# Patient Record
Sex: Male | Born: 1985 | Race: Black or African American | Hispanic: No | Marital: Married | Smoking: Never smoker
Health system: Southern US, Community
[De-identification: ages and names within clinical notes are randomized; demographics above are authoritative.]

## PROBLEM LIST (undated history)

## (undated) DIAGNOSIS — J45909 Unspecified asthma, uncomplicated: Secondary | ICD-10-CM

---
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
## 2004-03-17 ENCOUNTER — Emergency Department (HOSPITAL_COMMUNITY): Admission: EM | Admit: 2004-03-17 | Discharge: 2004-03-17 | Payer: Self-pay | Admitting: Emergency Medicine

## 2009-10-24 ENCOUNTER — Emergency Department (HOSPITAL_COMMUNITY): Admission: EM | Admit: 2009-10-24 | Discharge: 2009-10-25 | Payer: Self-pay | Admitting: Emergency Medicine

## 2009-10-28 ENCOUNTER — Encounter: Admission: RE | Admit: 2009-10-28 | Discharge: 2009-10-28 | Payer: Self-pay | Admitting: Chiropractic Medicine

## 2009-10-28 ENCOUNTER — Emergency Department (HOSPITAL_COMMUNITY): Admission: EM | Admit: 2009-10-28 | Discharge: 2009-10-28 | Payer: Self-pay | Admitting: Emergency Medicine

## 2009-12-20 ENCOUNTER — Ambulatory Visit (HOSPITAL_COMMUNITY): Admission: RE | Admit: 2009-12-20 | Discharge: 2009-12-20 | Payer: Self-pay | Admitting: Chiropractic Medicine

## 2011-03-20 ENCOUNTER — Emergency Department (HOSPITAL_COMMUNITY)
Admission: EM | Admit: 2011-03-20 | Discharge: 2011-03-20 | Disposition: A | Discharge status: Home / Self Care | Payer: No Typology Code available for payment source | Attending: Emergency Medicine | Admitting: Emergency Medicine

## 2011-03-20 ENCOUNTER — Emergency Department (HOSPITAL_COMMUNITY): Payer: No Typology Code available for payment source

## 2011-03-20 DIAGNOSIS — IMO0002 Reserved for concepts with insufficient information to code with codable children: Secondary | ICD-10-CM | POA: Insufficient documentation

## 2011-03-20 DIAGNOSIS — Y92410 Unspecified street and highway as the place of occurrence of the external cause: Secondary | ICD-10-CM | POA: Insufficient documentation

## 2011-03-20 DIAGNOSIS — R079 Chest pain, unspecified: Secondary | ICD-10-CM | POA: Insufficient documentation

## 2011-03-20 DIAGNOSIS — S298XXA Other specified injuries of thorax, initial encounter: Secondary | ICD-10-CM | POA: Insufficient documentation

## 2011-03-20 MED ORDER — OXYCODONE-ACETAMINOPHEN 5-325 MG PO TABS: 1.0000 | ORAL_TABLET | Freq: Four times a day (QID) | ORAL | Status: AC | PRN
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
## 2011-03-20 NOTE — ED Notes (Signed)
Pt given discharge instructions stated understanding amb indep to discharge window
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
## 2012-02-22 ENCOUNTER — Ambulatory Visit: Payer: Self-pay | Admitting: Physician Assistant

## 2012-02-22 VITALS — BP 130/94 | HR 95 | Temp 98.5°F | Resp 16 | Ht 72.5 in | Wt 196.0 lb

## 2012-02-22 DIAGNOSIS — Z113 Encounter for screening for infections with a predominantly sexual mode of transmission: Secondary | ICD-10-CM

## 2012-02-22 NOTE — Progress Notes (Signed)
  Subjective:    Patient ID: Charles Camacho, male    DOB: 1985-09-11, 26 y.o.   MRN: [id number]  HPI  Charles Camacho is a 26 yr old male who would like STD testing today.  He denies symptoms.  No rashes, lesions, or discharge.  Does state that he occasionally gets penile pain that is 2-3/10 on pain scale but resolves quickly on it's own.  This happens "once in a blue moon" he says.  Denies urinary symptoms.  Has had 3-4 sexual partners in the last 3 months.  Uses condoms almost every time.  States that maybe 2 times he has not used a condom.  No partners have symptoms that he is aware of.  Denies fever or chills.  Thinks he has been tested but it's been awhile.    Pt does not have a PCP.  Is initially interested in a CPE but then changes his mind, stating he just wants to do STD testing today.     Review of Systems  Constitutional: Negative for fever and chills.  HENT: Negative.   Respiratory: Negative.   Cardiovascular: Negative.   Gastrointestinal: Negative.   Genitourinary: Positive for penile pain. Negative for dysuria, frequency, hematuria, discharge, difficulty urinating, genital sores and testicular pain.  Neurological: Negative.        Objective:   Physical Exam  Vitals reviewed. Constitutional: He is oriented to person, place, and time. He appears well-developed and well-nourished. No distress.  HENT:  Head: Normocephalic and atraumatic.  Neck: Neck supple. No thyromegaly present.  Cardiovascular: Normal rate, regular rhythm and intact distal pulses.  Exam reveals no gallop and no friction rub.   No murmur heard. Pulmonary/Chest: Breath sounds normal. He has no wheezes. He has no rales.  Lymphadenopathy:    He has no cervical adenopathy.  Neurological: He is alert and oriented to person, place, and time.  Skin: Skin is warm and dry.  Psychiatric: He has a normal mood and affect. His behavior is normal.     Filed Vitals:   02/22/12 1406  BP: 130/94  Pulse: 95  Temp: 98.5  F (36.9 C)  Resp: 16        Assessment & Plan:   1. Screening for STD (sexually transmitted disease)  GC/chlamydia probe amp, urine, HIV antibody, RPR    Charles Camacho is a 26 yr old male here for STD testing.  He is asymptomatic at this time.  Unclear etiology of penile pain, but this is occurring very infrequently.  Will test for GC/chlamydia, HIV, and syphilis.  If labs are negative and pain persists, he will RTC for further evaluation.  Encouraged him to RTC for full CPE.

## 2012-02-22 NOTE — Patient Instructions (Addendum)
Safer Sex  Your caregiver wants you to have this information about the infections that can be transmitted from sexual contact and how to prevent them. The idea behind safer sex is that you can be sexually active, and at the same time reduce the risk of giving or getting a sexually transmitted disease (STD). Every person should be aware of how to prevent him or herself and his or her sex partner from getting an STD.  CAUSES OF STDS  STDs are transmitted by sharing body fluids, which contain viruses and bacteria. The following fluids all transmit infections during sexual intercourse and sex acts:  · Semen.  · Saliva.  · Urine.  · Blood.  · Vaginal mucus.  Examples of STDs include:  · Chlamydia.  · Gonorrhea.  · Genital herpes.  · Hepatitis B.  · Human immunodeficiency virus or acquired immunodeficiency syndrome (HIV or AIDS).  · Syphilis.  · Trichomonas.  · Pubic lice.  · Human papillomavirus (HPV), which may include:  · Genital warts.  · Cervical dysplasia.  · Cervical cancer (can develop with certain types of HPV).  SYMPTOMS   Sexual diseases often cause few or no symptoms until they are advanced, so a person can be infected and spread the infection without knowing it. Some STDs respond to treatment very well. Others, like HIV and herpes, cannot be cured, but are treated to reduce their effects.  Specific symptoms include:  · Abnormal vaginal discharge.  · Irritation or itching in and around the vagina, and in the pubic hair.  · Pain during sexual intercourse.  · Bleeding during sexual intercourse.  · Pelvic or abdominal pain.  · Fever.  · Growths in and around the vagina.  · An ulcer in or around the vagina.  · Swollen glands in the groin area.  DIAGNOSIS   · Blood tests.  · Pap test.  · Culture test of abnormal vaginal discharge.  · A test that applies a solution and examines the cervix with a lighted magnifying scope (colposcopy).  · A test that examines the pelvis with a lighted tube, through a small incision  (laparoscopy).  TREATMENT   The treatment will depend on the cause of the STD.  · Antibiotic treatment by injection, oral, creams, or suppositories in the vagina.  · Over-the-counter medicated shampoo, to get rid of pubic lice.  · Removing or treating growths with medicine, freezing, burning (electrocautery), or surgery.  · Surgery treatment for HPV of the cervix.  · Supportive medicines for herpes, HIV, AIDS, and hepatitis.  Being careful cannot eliminate all risk of infection, but sex can be made much safer.  Safe sexual practices include body massage and gentle touching. Masturbation is safe, as long as body fluids do not contact skin that has sores or cuts. Dry kissing and oral sex on a man wearing a latex condom or on a woman wearing a male condom is also safe. Slightly less safe is intercourse while the man wears a latex condom or wet kissing. It is also safer to have one sex partner that you know is not having sex with anyone else.  LENGTH OF ILLNESS  An STD might be treated and cured in a week, sometimes a month, or more. And it can linger with symptoms for many years. STDs can also cause damage to the male organs. This can cause chronic pain, infertility, and recurrence of the STD, especially herpes, hepatitis, HIV, and HPV.  HOME CARE INSTRUCTIONS AND PREVENTION  · Alcohol   and recreational drugs are often the reason given for not practicing safer sex. These substances affect your judgment. Alcohol and recreational drugs can also impair your immune system, making you more vulnerable to disease.  · Do not engage in risky and dangerous sexual practices, including:  · Vaginal or anal sex without a condom.  · Oral sex on a man without a condom.  · Oral sex on a woman without a male condom.  · Using saliva to lubricate a condom.  · Any other sexual contact in which body fluids or blood from one partner contact the other partner.  · You should use only latex condoms for men and water soluble lubricants.  Petroleum based lubricants or oils used to lubricate a condom will weaken the condom and increase the chance that it will break.  · Think very carefully before having sex with anyone who is high risk for STDs and HIV. This includes IV drug users, people with multiple sexual partners, or people who have had an STD, or a positive hepatitis or HIV blood test.  · Remember that even if your partner has had only one previous partner, their previous partner might have had multiple partners. If so, you are at high risk of being exposed to an STD. You and your sex partner should be the only sex partners with each other, with no one else involved.  · A vaccine is available for hepatitis B and HPV through your caregiver or the Public Health Department. Everyone should be vaccinated with these vaccines.  · Avoid risky sex practices. Sex acts that can break the skin make you more likely to get an STD.  SEEK MEDICAL CARE IF:   · If you think you have an STD, even if you do not have any symptoms. Contact your caregiver for evaluation and treatment, if needed.  · You think or know your sex partner has acquired an STD.  · You have any of the symptoms mentioned above.  Document Released: 04/29/2004 Document Revised: 06/14/2011 Document Reviewed: 02/19/2009  Exit[employer]® Patient Information ©2013 [employer].

## 2012-02-23 LAB — GC/CHLAMYDIA PROBE AMP, URINE
Chlamydia, Swab/Urine, PCR: NEGATIVE
GC Probe Amp, Urine: NEGATIVE

## 2012-02-23 LAB — RPR

## 2013-03-23 ENCOUNTER — Emergency Department (HOSPITAL_COMMUNITY)
Admission: EM | Admit: 2013-03-23 | Discharge: 2013-03-23 | Disposition: A | Discharge status: Home / Self Care | Payer: No Typology Code available for payment source | Attending: Emergency Medicine | Admitting: Emergency Medicine

## 2013-03-23 ENCOUNTER — Encounter (HOSPITAL_COMMUNITY): Payer: Self-pay | Admitting: Emergency Medicine

## 2013-03-23 DIAGNOSIS — J3489 Other specified disorders of nose and nasal sinuses: Secondary | ICD-10-CM | POA: Insufficient documentation

## 2013-03-23 DIAGNOSIS — R059 Cough, unspecified: Secondary | ICD-10-CM

## 2013-03-23 DIAGNOSIS — R05 Cough: Secondary | ICD-10-CM

## 2013-03-23 DIAGNOSIS — J45901 Unspecified asthma with (acute) exacerbation: Secondary | ICD-10-CM | POA: Insufficient documentation

## 2013-03-23 NOTE — ED Notes (Signed)
Pt reports he has had a cough and congestion for the past 3 days. States that he thinks he might have PNA. Denies any fever at this time

## 2013-03-23 NOTE — ED Provider Notes (Signed)
omaCSN: [id number]     Arrival date & time 03/23/13  1114 History  This chart was scribed for non-physician practitioner Junius Finner, PA-C, working with Gwyneth Sprout, MD by Dorothey Baseman, ED Scribe. This patient was seen in room TR06C/TR06C and the patient's care was started at 12:05 PM.   Chief Complaint  Patient presents with  . Cough   The history is provided by the patient. No language interpreter was used.   HPI Comments: Charles Camacho is a 27 y.o. male who presents to the Emergency Department complaining of a constant, dry cough with associated congestion and shortness of breath secondary to the cough onset 2 days ago. He reports taking OTC allergy medication and Mucinex at home without relief. Patient expresses concern for pneumonia due to him knowing someone that recently passed away from it. He denies fever, emesis, nausea, sore throat. Patient denies any recent extended travel. He reports a history of mild childhood asthma. Patient has no other pertinent medical history.   History reviewed. No pertinent past medical history. History reviewed. No pertinent past surgical history. History reviewed. No pertinent family history. History  Substance Use Topics  . Smoking status: Never Smoker   . Smokeless tobacco: Not on file  . Alcohol Use: No    Review of Systems  Constitutional: Negative for fever.  HENT: Positive for congestion. Negative for sore throat.   Respiratory: Positive for cough and shortness of breath.   Gastrointestinal: Negative for nausea and vomiting.  All other systems reviewed and are negative.    Allergies  Shrimp  Home Medications   Current Outpatient Rx  Name  Route  Sig  Dispense  Refill  . Multiple Vitamin (MULITIVITAMIN WITH MINERALS) TABS   Oral   Take 1 tablet by mouth daily.            Triage Vitals: BP 163/82  Pulse 77  Temp(Src) 98.3 F (36.8 C) (Oral)  Resp 18  Ht 6\' 2"  (1.88 m)  Wt 202 lb 6 oz (91.797 kg)  BMI 25.97 kg/m2   SpO2 97%  Physical Exam  Nursing note and vitals reviewed. Constitutional: He is oriented to person, place, and time. He appears well-developed and well-nourished.  HENT:  Head: Normocephalic and atraumatic.  Right Ear: Hearing, tympanic membrane, external ear and ear canal normal.  Left Ear: Hearing, tympanic membrane, external ear and ear canal normal.  Mouth/Throat: Oropharynx is clear and moist. No oropharyngeal exudate.  Eyes: EOM are normal.  Neck: Normal range of motion.  Cardiovascular: Normal rate, regular rhythm and normal heart sounds.   Pulmonary/Chest: Effort normal and breath sounds normal. No respiratory distress.  Musculoskeletal: Normal range of motion.  Neurological: He is alert and oriented to person, place, and time.  Skin: Skin is warm and dry.  Psychiatric: He has a normal mood and affect. His behavior is normal.    ED Course  Procedures (including critical care time)  DIAGNOSTIC STUDIES: Oxygen Saturation is 97% on room air, normal by my interpretation.    COORDINATION OF CARE: 12:07 PM- Discussed that symptoms are likely viral in nature and that imaging will not be necessary today in the ED. Advised patient to use cough suppressants at home to manage symptoms. Return precautions given. Discussed treatment plan with patient at bedside and patient verbalized agreement.     Labs Review Labs Reviewed - No data to display Imaging Review No results found.  EKG Interpretation   None       MDM  1. Cough     I personally performed the services described in this documentation, which was scribed in my presence. The recorded information has been reviewed and is accurate.    Junius Finner, PA-C 03/23/13 1224

## 2013-03-23 NOTE — ED Provider Notes (Signed)
Medical screening examination/treatment/procedure(s) were performed by non-physician practitioner and as supervising physician I was immediately available for consultation/collaboration.  EKG Interpretation   None         Juel Bellerose, MD 03/23/13 1611 

## 2014-05-30 ENCOUNTER — Emergency Department (HOSPITAL_COMMUNITY): Payer: 59

## 2014-05-30 ENCOUNTER — Emergency Department (HOSPITAL_COMMUNITY)
Admission: EM | Admit: 2014-05-30 | Discharge: 2014-05-30 | Disposition: A | Discharge status: Home / Self Care | Payer: 59 | Attending: Emergency Medicine | Admitting: Emergency Medicine

## 2014-05-30 ENCOUNTER — Encounter (HOSPITAL_COMMUNITY): Payer: Self-pay | Admitting: *Deleted

## 2014-05-30 DIAGNOSIS — R109 Unspecified abdominal pain: Secondary | ICD-10-CM

## 2014-05-30 DIAGNOSIS — R1011 Right upper quadrant pain: Secondary | ICD-10-CM | POA: Insufficient documentation

## 2014-05-30 DIAGNOSIS — R0781 Pleurodynia: Secondary | ICD-10-CM

## 2014-05-30 DIAGNOSIS — R0789 Other chest pain: Secondary | ICD-10-CM | POA: Diagnosis not present

## 2014-05-30 HISTORY — DX: Unspecified asthma, uncomplicated: J45.909

## 2014-05-30 LAB — CBC WITH DIFFERENTIAL/PLATELET
Basophils Absolute: 0 10*3/uL (ref 0.0–0.1)
Basophils Relative: 0 % (ref 0–1)
EOS ABS: 0.2 10*3/uL (ref 0.0–0.7)
Eosinophils Relative: 2 % (ref 0–5)
HEMATOCRIT: 46.4 % (ref 39.0–52.0)
Hemoglobin: 16.4 g/dL (ref 13.0–17.0)
Lymphocytes Relative: 47 % — ABNORMAL HIGH (ref 12–46)
Lymphs Abs: 4.9 10*3/uL — ABNORMAL HIGH (ref 0.7–4.0)
MCH: 31.1 pg (ref 26.0–34.0)
MCHC: 35.3 g/dL (ref 30.0–36.0)
MCV: 88 fL (ref 78.0–100.0)
Monocytes Absolute: 0.7 10*3/uL (ref 0.1–1.0)
Monocytes Relative: 7 % (ref 3–12)
Neutro Abs: 4.6 10*3/uL (ref 1.7–7.7)
Neutrophils Relative %: 44 % (ref 43–77)
Platelets: 217 10*3/uL (ref 150–400)
RBC: 5.27 MIL/uL (ref 4.22–5.81)
RDW: 12.7 % (ref 11.5–15.5)
WBC: 10.4 10*3/uL (ref 4.0–10.5)

## 2014-05-30 LAB — COMPREHENSIVE METABOLIC PANEL
ALK PHOS: 46 U/L (ref 39–117)
ALT: 24 U/L (ref 0–53)
AST: 22 U/L (ref 0–37)
Albumin: 4.7 g/dL (ref 3.5–5.2)
Anion gap: 8 (ref 5–15)
BUN: 12 mg/dL (ref 6–23)
CO2: 29 mmol/L (ref 19–32)
CREATININE: 1.23 mg/dL (ref 0.50–1.35)
Calcium: 9.6 mg/dL (ref 8.4–10.5)
Chloride: 101 mmol/L (ref 96–112)
GFR calc Af Amer: >90 mL/min (ref >90)
GFR, EST NON AFRICAN AMERICAN: 78 mL/min — AB (ref >90)
Glucose, Bld: 101 mg/dL — ABNORMAL HIGH (ref 70–99)
Potassium: 3.7 mmol/L (ref 3.5–5.1)
Sodium: 138 mmol/L (ref 135–145)
Total Bilirubin: 1.6 mg/dL — ABNORMAL HIGH (ref 0.3–1.2)
Total Protein: 7.8 g/dL (ref 6.0–8.3)

## 2014-05-30 LAB — URINALYSIS, ROUTINE W REFLEX MICROSCOPIC
Bilirubin Urine: NEGATIVE
GLUCOSE, UA: NEGATIVE mg/dL
Hgb urine dipstick: NEGATIVE
KETONES UR: NEGATIVE mg/dL
Leukocytes, UA: NEGATIVE
Nitrite: NEGATIVE
PH: 6.5 (ref 5.0–8.0)
Protein, ur: NEGATIVE mg/dL
Specific Gravity, Urine: 1.004 — ABNORMAL LOW (ref 1.005–1.030)
Urobilinogen, UA: 0.2 mg/dL (ref 0.0–1.0)

## 2014-05-30 LAB — LIPASE, BLOOD: Lipase: 29 U/L (ref 11–59)

## 2014-05-30 MED ORDER — IOHEXOL 300 MG/ML  SOLN
25.0000 mL | Freq: Once | INTRAMUSCULAR | Status: AC | PRN
  Administered 2014-05-30: 25 mL via ORAL

## 2014-05-30 MED ORDER — IOHEXOL 300 MG/ML  SOLN
100.0000 mL | Freq: Once | INTRAMUSCULAR | Status: AC | PRN
  Administered 2014-05-30: 100 mL via INTRAVENOUS

## 2014-05-30 MED ORDER — HYDROMORPHONE HCL 1 MG/ML IJ SOLN
1.0000 mg | Freq: Once | INTRAMUSCULAR | Status: AC
  Administered 2014-05-30: 1 mg via INTRAVENOUS
  Filled 2014-05-30: qty 1

## 2014-05-30 MED ORDER — HYDROCODONE-ACETAMINOPHEN 5-325 MG PO TABS: 1.0000 | ORAL_TABLET | ORAL | Status: DC | PRN

## 2014-05-30 NOTE — ED Notes (Signed)
Pt. Was asleep and woke up to 9/10 tight pain in his upper right abdomen. Pt. States pain gets worse when he exhales

## 2014-05-30 NOTE — ED Notes (Signed)
Patient returned from xray.

## 2014-05-30 NOTE — ED Notes (Signed)
Pt finished drinking contrast. CT notified. 

## 2014-05-30 NOTE — ED Provider Notes (Signed)
CSN: [id number]     Arrival date & time 05/30/14  0517 History   First MD Initiated Contact with Patient 05/30/14 0518     Chief Complaint  Patient presents with  . Abdominal Pain      HPI Patient presents to the emergency department complaining of rather abrupt onset right upper quadrant abdominal pain as well as right-sided abdominal pain.  He states it hurts worse in his abdomen when he takes a deep breath.  He feels like at times he cannot get his breath completely out.  He denies nausea vomiting.  Denies diarrhea.  No fevers or chills.  No prior symptoms similar to this.  Denies flank pain.  No urinary complaints.  Pain is moderate to severe in severity at this time.   History reviewed. No pertinent past medical history. History reviewed. No pertinent past surgical history. History reviewed. No pertinent family history. History  Substance Use Topics  . Smoking status: Never Smoker   . Smokeless tobacco: Never Used  . Alcohol Use: Yes     Comment: special occasions    Review of Systems  All other systems reviewed and are negative.     Allergies  Shrimp  Home Medications   Prior to Admission medications   Medication Sig Start Date End Date Taking? Authorizing Provider  HYDROcodone-acetaminophen (NORCO/VICODIN) 5-325 MG per tablet Take 1 tablet by mouth every 4 (four) hours as needed for moderate pain. 05/30/14   Lyanne CoKevin M Nashalie Sallis, MD   BP 134/88 mmHg  Pulse 84  Temp(Src) 97.8 F (36.6 C) (Oral)  Resp 15  Ht 6' (1.829 m)  Wt 200 lb (90.719 kg)  BMI 27.12 kg/m2  SpO2 99% Physical Exam  Constitutional: He is oriented to person, place, and time. He appears well-developed and well-nourished.  HENT:  Head: Normocephalic and atraumatic.  Eyes: EOM are normal.  Neck: Normal range of motion.  Cardiovascular: Normal rate, regular rhythm, normal heart sounds and intact distal pulses.   Pulmonary/Chest: Effort normal and breath sounds normal. No respiratory distress.   Abdominal: Soft. He exhibits no distension.  Right upper quadrant and right-sided abdominal tenderness without guarding or rebound.  Musculoskeletal: Normal range of motion.  Neurological: He is alert and oriented to person, place, and time.  Skin: Skin is warm and dry.  Psychiatric: He has a normal mood and affect. Judgment normal.  Nursing note and vitals reviewed.   ED Course  Procedures (including critical care time) Labs Review Labs Reviewed  CBC WITH DIFFERENTIAL/PLATELET - Abnormal; Notable for the following:    Lymphocytes Relative 47 (*)    Lymphs Abs 4.9 (*)    All other components within normal limits  COMPREHENSIVE METABOLIC PANEL - Abnormal; Notable for the following:    Glucose, Bld 101 (*)    Total Bilirubin 1.6 (*)    GFR calc non Af Amer 78 (*)    All other components within normal limits  URINALYSIS, ROUTINE W REFLEX MICROSCOPIC - Abnormal; Notable for the following:    Specific Gravity, Urine 1.004 (*)    All other components within normal limits  LIPASE, BLOOD    Imaging Review Dg Abd Acute W/chest  05/30/2014   CLINICAL DATA:  Abdominal pain. Pleuritic pain. Symptoms for 1 day.  EXAM: ACUTE ABDOMEN SERIES (ABDOMEN 2 VIEW & CHEST 1 VIEW)  COMPARISON:  08/18/2010  FINDINGS: The cardiomediastinal contours are normal. The lungs are clear. There is no consolidation, pleural effusion, or pneumothorax.  There is no free intra-abdominal air.  No dilated bowel loops to suggest obstruction. Small volume of stool throughout the colon. No radiopaque calculi. Mild scoliotic curvature of the thoracolumbar spine. No acute osseous abnormalities are seen.  IMPRESSION: 1. Normal bowel gas pattern. 2. Clear lungs.   Electronically Signed   By: Rubye Oaks M.D.   On: 05/30/2014 06:35  I personally reviewed the imaging tests through PACS system I reviewed available ER/hospitalization records through the EMR    EKG Interpretation None      MDM   Final diagnoses:   Abdominal pain  Pleuritic pain    Acute abdominal series without acute pathology.  Patient remains somewhat tender in his right side of his abdomen.  CT scan of his abdomen and pelvis will be completed to further evaluate.  Some of this may represent pain related to colonic gas.  Low suspicion for PE.  Patient is PERC negative.  Chest x-ray is clear.  CT pending. Care to Dr Radford Pax    Lyanne Co, MD 05/30/14 [phone]

## 2014-05-30 NOTE — ED Notes (Signed)
Patient transported to CT 

## 2014-05-30 NOTE — Discharge Instructions (Signed)

## 2014-05-30 NOTE — ED Notes (Signed)
Patient transported to X-ray 

## 2015-05-23 ENCOUNTER — Emergency Department (HOSPITAL_COMMUNITY): Payer: Self-pay

## 2015-05-23 ENCOUNTER — Emergency Department (HOSPITAL_COMMUNITY)
Admission: EM | Admit: 2015-05-23 | Discharge: 2015-05-23 | Disposition: A | Discharge status: Home / Self Care | Payer: Self-pay | Attending: Emergency Medicine | Admitting: Emergency Medicine

## 2015-05-23 ENCOUNTER — Encounter (HOSPITAL_COMMUNITY): Payer: Self-pay | Admitting: Emergency Medicine

## 2015-05-23 DIAGNOSIS — M542 Cervicalgia: Secondary | ICD-10-CM | POA: Insufficient documentation

## 2015-05-23 DIAGNOSIS — J45909 Unspecified asthma, uncomplicated: Secondary | ICD-10-CM | POA: Insufficient documentation

## 2015-05-23 MED ORDER — ACETAMINOPHEN 325 MG PO TABS
650.0000 mg | ORAL_TABLET | Freq: Once | ORAL | Status: AC
  Administered 2015-05-23: 650 mg via ORAL
  Filled 2015-05-23: qty 2

## 2015-05-23 NOTE — ED Notes (Signed)
Started with neck "soreness" after swallowing 3 days ago. States pain is on the right side of neck and goes to right ear. Tender to palpation, movement and swallowing.

## 2015-05-23 NOTE — ED Provider Notes (Signed)
CSN: [id number]     Arrival date & time 05/23/15  4098 History  By signing my name below, I, Charles Camacho, attest that this documentation has been prepared under the direction and in the presence of Brytnee Bechler, PA-C. Electronically Signed: Elon Camacho ED Scribe. 05/23/2015. 9:03 AM.    Chief Complaint  Patient presents with  . Neck Pain   The history is provided by the patient. No language interpreter was used.   HPI Comments: Charles Camacho is a 30 y.o. male who presents to the Emergency Department complaining of "tight" pain and swelling on the right-side of the neck radiating to the ear onset 3 days ago. He reports the pain onset after feeling "like I had to burp". He states it feels like his neck is sore or he strained a muscle. He denies difficulty swallowing, handling secretions or breathing but states that food does not feel "like its going down normal." The pain is worse with palpation, laying on the right side, swallowing, and turning neck to the left. He states he "feels like his glands are swollen". Denies change in swelling with eating. He has used BC powder with complete relief of pain but states it returned after a few hours. Denies fevers, chills, headache, ear drainage, hearing loss, nasal congestion, rhinorrhea, sore throat, cough, SOB, nausea, vomiting, regurgitating food or any other complaints.    Past Medical History  Diagnosis Date  . Asthma     In the past   History reviewed. No pertinent past surgical history. No family history on file. Social History  Substance Use Topics  . Smoking status: Never Smoker   . Smokeless tobacco: Never Used  . Alcohol Use: Yes     Comment: special occasions    Review of Systems  Musculoskeletal: Positive for neck pain.  All other systems reviewed and are negative.     Allergies  Shrimp  Home Medications   Prior to Admission medications   Medication Sig Start Date End Date Taking? Authorizing Provider   HYDROcodone-acetaminophen (NORCO/VICODIN) 5-325 MG per tablet Take 1 tablet by mouth every 4 (four) hours as needed for moderate pain. 05/30/14   [name], MD   BP 137/90 mmHg  Pulse 70  Temp(Src) 98 F (36.7 C) (Oral)  Resp 16  Ht  (1.88 m)  Wt 90.89 kg  BMI 25.72 kg/m2  SpO2 98% Physical Exam  Constitutional: He is oriented to person, place, and time. He appears well-developed and well-nourished. No distress.  HENT:  Head: Normocephalic and atraumatic.  Right Ear: Tympanic membrane and ear canal normal.  Left Ear: Tympanic membrane and ear canal normal.  Mouth/Throat: Oropharynx is clear and moist.  No oropharyngeal erythema, edema or exudate. Handling secretions well.  Eyes: Conjunctivae and EOM are normal.  Neck: Normal range of motion. Neck supple. Muscular tenderness present. No spinous process tenderness present. No rigidity. No tracheal deviation, no erythema and normal range of motion present.    Mild tenderness to musculature of right lateral neck. No swelling or cervical adenopathy. No pulsations. FROM of neck intact. Reports pain on rotation of neck to left. No rigidity. Pt is handling secretions well. No overlying skin changes. Pt drinks water without difficulty in ED. No stridor or increased work of breathing.   Cardiovascular: Normal rate.   Pulmonary/Chest: Effort normal. No stridor. No respiratory distress.  Musculoskeletal: Normal range of motion.  Neurological: He is alert and oriented to person, place, and time.  Skin: Skin is warm and  dry.  Psychiatric: He has a normal mood and affect. His behavior is normal.  Nursing note and vitals reviewed.   ED Course  Procedures (including critical care time)  DIAGNOSTIC STUDIES: Oxygen Saturation is 98% on RA, normal by my interpretation.    COORDINATION OF CARE:  9:33 AM Discussed plan to obtain imaging of neck.  Patient acknowledges and agrees with plan.     Labs Review Labs Reviewed - No data to  display  Imaging Review Dg Neck Soft Tissue  05/23/2015  CLINICAL DATA:  Right neck pain and swelling for 3 days EXAM: NECK SOFT TISSUES - 1+ VIEW COMPARISON:  October 25, 2009 FINDINGS: Frontal lateral views were obtained. There is no evidence of retropharyngeal soft tissue swelling or epiglottic enlargement. The cervical airway is unremarkable, and no radio-opaque foreign body identified. Tongue base region appears normal. No tonsillar or adenoidal enlargement is noted. There is slight degenerative change in the thoracic spine. No soft tissue mass is apparent by radiography. IMPRESSION: No soft tissue mass apparent by radiography. The epiglottis and aryepiglottic fold regions appear within normal limits. No inflammatory focus evident. If there remains concern for potential pathology in the neck region, CT with intravenous contrast would be the imaging study of choice to further assess. Electronically Signed   By: Bretta Bang III M.D.   On: 05/23/2015 10:07   I have personally reviewed and evaluated these images and lab results as part of my medical decision-making.   EKG Interpretation None      MDM   Final diagnoses:  Neck pain   30 year old male presenting with right lateral neck pain after "feeling like I had to burp" 3 days ago. Reports pain to palpation and feels like he is not swallowing normally. He denies difficulty swallowing and has been able to eat and drink over the past 3 days. He is drinking water easily in the emergency department. He has no other complaints today. Vital signs stable. Patient is nontoxic appearing and in no acute distress. Very mild tenderness to palpation of the musculature at the right neck. Tenderness is not over vasculature. No pulsations. No muscle spasm. Full range of motion of the neck intact. No swelling noted. No overlying skin changes. Oropharynx is without edema, erythema or exudate. X-ray of neck soft tissues without mass or swelling. Patient ports  full resolution of symptoms with over-the-counter pain relievers. Instructed patient to continue using over-the-counter pain relievers and provided with neck exercises for musculoskeletal pain. Instructed to follow up with his primary care provider in 3-5 days for reevaluation of his neck pain. Patient is to return to the emergency department with worsening neck pain, inability to move the neck, fevers, inability to swallow, shortness of breath or sensation of throat swelling. Discussed this with patient and all questions answered. Return precautions given in discharge paperwork and discussed with pt at bedside. Pt stable for discharge   I personally performed the services described in this documentation, which was scribed in my presence. The recorded information has been reviewed and is accurate.    Alveta Heimlich, PA-C 05/23/15 1034  Mancel Bale, MD 05/23/15 1520

## 2015-05-23 NOTE — ED Notes (Signed)
Pt has many questions about test results. PA informed.

## 2015-05-23 NOTE — Discharge Instructions (Signed)
Continue taking OTC pain relievers such as tylenol or motrin. Use the neck exercises provided. Schedule a follow up with your PCP in the next 3-5 days.  Return to ED with fevers, chills, worsening neck pain, inability to swallow, drooling, sensation of throat closing or any other new, worsening or concerning symptoms.    Cervical Strain and Sprain With Rehab Cervical strain and sprain are injuries that commonly occur with "whiplash" injuries. Whiplash occurs when the neck is forcefully whipped backward or forward, such as during a motor vehicle accident or during contact sports. The muscles, ligaments, tendons, discs, and nerves of the neck are susceptible to injury when this occurs. RISK FACTORS Risk of having a whiplash injury increases if:  Osteoarthritis of the spine.  Situations that make head or neck accidents or trauma more likely.  High-risk sports (football, rugby, wrestling, hockey, auto racing, gymnastics, diving, contact karate, or boxing).  Poor strength and flexibility of the neck.  Previous neck injury.  Poor tackling technique.  Improperly fitted or padded equipment. SYMPTOMS   Pain or stiffness in the front or back of neck or both.  Symptoms may present immediately or up to 24 hours after injury.  Dizziness, headache, nausea, and vomiting.  Muscle spasm with soreness and stiffness in the neck.  Tenderness and swelling at the injury site. PREVENTION  Learn and use proper technique (avoid tackling with the head, spearing, and head-butting; use proper falling techniques to avoid landing on the head).  Warm up and stretch properly before activity.  Maintain physical fitness:  Strength, flexibility, and endurance.  Cardiovascular fitness.  Wear properly fitted and padded protective equipment, such as padded soft collars, for participation in contact sports. PROGNOSIS  Recovery from cervical strain and sprain injuries is dependent on the extent of the injury.  These injuries are usually curable in 1 week to 3 months with appropriate treatment.  RELATED COMPLICATIONS   Temporary numbness and weakness may occur if the nerve roots are damaged, and this may persist until the nerve has completely healed.  Chronic pain due to frequent recurrence of symptoms.  Prolonged healing, especially if activity is resumed too soon (before complete recovery). TREATMENT  Treatment initially involves the use of ice and medication to help reduce pain and inflammation. It is also important to perform strengthening and stretching exercises and modify activities that worsen symptoms so the injury does not get worse. These exercises may be performed at home or with a therapist. For patients who experience severe symptoms, a soft, padded collar may be recommended to be worn around the neck.  Improving your posture may help reduce symptoms. Posture improvement includes pulling your chin and abdomen in while sitting or standing. If you are sitting, sit in a firm chair with your buttocks against the back of the chair. While sleeping, try replacing your pillow with a small towel rolled to 2 inches in diameter, or use a cervical pillow or soft cervical collar. Poor sleeping positions delay healing.  For patients with nerve root damage, which causes numbness or weakness, the use of a cervical traction apparatus may be recommended. Surgery is rarely necessary for these injuries. However, cervical strain and sprains that are present at birth (congenital) may require surgery. MEDICATION   If pain medication is necessary, nonsteroidal anti-inflammatory medications, such as aspirin and ibuprofen, or other minor pain relievers, such as acetaminophen, are often recommended.  Do not take pain medication for 7 days before surgery.  Prescription pain relievers may be given if  deemed necessary by your caregiver. Use only as directed and only as much as you need. HEAT AND COLD:   Cold treatment  (icing) relieves pain and reduces inflammation. Cold treatment should be applied for 10 to 15 minutes every 2 to 3 hours for inflammation and pain and immediately after any activity that aggravates your symptoms. Use ice packs or an ice massage.  Heat treatment may be used prior to performing the stretching and strengthening activities prescribed by your caregiver, physical therapist, or athletic trainer. Use a heat pack or a warm soak. SEEK MEDICAL CARE IF:   Symptoms get worse or do not improve in 2 weeks despite treatment.  New, unexplained symptoms develop (drugs used in treatment may produce side effects). EXERCISES RANGE OF MOTION (ROM) AND STRETCHING EXERCISES - Cervical Strain and Sprain These exercises may help you when beginning to rehabilitate your injury. In order to successfully resolve your symptoms, you must improve your posture. These exercises are designed to help reduce the forward-head and rounded-shoulder posture which contributes to this condition. Your symptoms may resolve with or without further involvement from your physician, physical therapist or athletic trainer. While completing these exercises, remember:   Restoring tissue flexibility helps normal motion to return to the joints. This allows healthier, less painful movement and activity.  An effective stretch should be held for at least 20 seconds, although you may need to begin with shorter hold times for comfort.  A stretch should never be painful. You should only feel a gentle lengthening or release in the stretched tissue. STRETCH- Axial Extensors  Lie on your back on the floor. You may bend your knees for comfort. Place a rolled-up hand towel or dish towel, about 2 inches in diameter, under the part of your head that makes contact with the floor.  Gently tuck your chin, as if trying to make a "double chin," until you feel a gentle stretch at the base of your head.  Hold __________ seconds. Repeat __________  times. Complete this exercise __________ times per day.  STRETCH - Axial Extension   Stand or sit on a firm surface. Assume a good posture: chest up, shoulders drawn back, abdominal muscles slightly tense, knees unlocked (if standing) and feet hip width apart.  Slowly retract your chin so your head slides back and your chin slightly lowers. Continue to look straight ahead.  You should feel a gentle stretch in the back of your head. Be certain not to feel an aggressive stretch since this can cause headaches later.  Hold for __________ seconds. Repeat __________ times. Complete this exercise __________ times per day. STRETCH - Cervical Side Bend   Stand or sit on a firm surface. Assume a good posture: chest up, shoulders drawn back, abdominal muscles slightly tense, knees unlocked (if standing) and feet hip width apart.  Without letting your nose or shoulders move, slowly tip your right / left ear to your shoulder until your feel a gentle stretch in the muscles on the opposite side of your neck.  Hold __________ seconds. Repeat __________ times. Complete this exercise __________ times per day. STRETCH - Cervical Rotators   Stand or sit on a firm surface. Assume a good posture: chest up, shoulders drawn back, abdominal muscles slightly tense, knees unlocked (if standing) and feet hip width apart.  Keeping your eyes level with the ground, slowly turn your head until you feel a gentle stretch along the back and opposite side of your neck.  Hold __________ seconds. Repeat __________  times. Complete this exercise __________ times per day. RANGE OF MOTION - Neck Circles   Stand or sit on a firm surface. Assume a good posture: chest up, shoulders drawn back, abdominal muscles slightly tense, knees unlocked (if standing) and feet hip width apart.  Gently roll your head down and around from the back of one shoulder to the back of the other. The motion should never be forced or painful.  Repeat  the motion 10-20 times, or until you feel the neck muscles relax and loosen. Repeat __________ times. Complete the exercise __________ times per day. STRENGTHENING EXERCISES - Cervical Strain and Sprain These exercises may help you when beginning to rehabilitate your injury. They may resolve your symptoms with or without further involvement from your physician, physical therapist, or athletic trainer. While completing these exercises, remember:   Muscles can gain both the endurance and the strength needed for everyday activities through controlled exercises.  Complete these exercises as instructed by your physician, physical therapist, or athletic trainer. Progress the resistance and repetitions only as guided.  You may experience muscle soreness or fatigue, but the pain or discomfort you are trying to eliminate should never worsen during these exercises. If this pain does worsen, stop and make certain you are following the directions exactly. If the pain is still present after adjustments, discontinue the exercise until you can discuss the trouble with your clinician. STRENGTH - Cervical Flexors, Isometric  Face a wall, standing about 6 inches away. Place a small pillow, a ball about 6-8 inches in diameter, or a folded towel between your forehead and the wall.  Slightly tuck your chin and gently push your forehead into the soft object. Push only with mild to moderate intensity, building up tension gradually. Keep your jaw and forehead relaxed.  Hold 10 to 20 seconds. Keep your breathing relaxed.  Release the tension slowly. Relax your neck muscles completely before you start the next repetition. Repeat __________ times. Complete this exercise __________ times per day. STRENGTH- Cervical Lateral Flexors, Isometric   Stand about 6 inches away from a wall. Place a small pillow, a ball about 6-8 inches in diameter, or a folded towel between the side of your head and the wall.  Slightly tuck your  chin and gently tilt your head into the soft object. Push only with mild to moderate intensity, building up tension gradually. Keep your jaw and forehead relaxed.  Hold 10 to 20 seconds. Keep your breathing relaxed.  Release the tension slowly. Relax your neck muscles completely before you start the next repetition. Repeat __________ times. Complete this exercise __________ times per day. STRENGTH - Cervical Extensors, Isometric   Stand about 6 inches away from a wall. Place a small pillow, a ball about 6-8 inches in diameter, or a folded towel between the back of your head and the wall.  Slightly tuck your chin and gently tilt your head back into the soft object. Push only with mild to moderate intensity, building up tension gradually. Keep your jaw and forehead relaxed.  Hold 10 to 20 seconds. Keep your breathing relaxed.  Release the tension slowly. Relax your neck muscles completely before you start the next repetition. Repeat __________ times. Complete this exercise __________ times per day. POSTURE AND BODY MECHANICS CONSIDERATIONS - Cervical Strain and Sprain Keeping correct posture when sitting, standing or completing your activities will reduce the stress put on different body tissues, allowing injured tissues a chance to heal and limiting painful experiences. The following are general  guidelines for improved posture. Your physician or physical therapist will provide you with any instructions specific to your needs. While reading these guidelines, remember:  The exercises prescribed by your provider will help you have the flexibility and strength to maintain correct postures.  The correct posture provides the optimal environment for your joints to work. All of your joints have less wear and tear when properly supported by a spine with good posture. This means you will experience a healthier, less painful body.  Correct posture must be practiced with all of your activities, especially  prolonged sitting and standing. Correct posture is as important when doing repetitive low-stress activities (typing) as it is when doing a single heavy-load activity (lifting). PROLONGED STANDING WHILE SLIGHTLY LEANING FORWARD When completing a task that requires you to lean forward while standing in one place for a long time, place either foot up on a stationary 2- to 4-inch high object to help maintain the best posture. When both feet are on the ground, the low back tends to lose its slight inward curve. If this curve flattens (or becomes too large), then the back and your other joints will experience too much stress, fatigue more quickly, and can cause pain.  RESTING POSITIONS Consider which positions are most painful for you when choosing a resting position. If you have pain with flexion-based activities (sitting, bending, stooping, squatting), choose a position that allows you to rest in a less flexed posture. You would want to avoid curling into a fetal position on your side. If your pain worsens with extension-based activities (prolonged standing, working overhead), avoid resting in an extended position such as sleeping on your stomach. Most people will find more comfort when they rest with their spine in a more neutral position, neither too rounded nor too arched. Lying on a non-sagging bed on your side with a pillow between your knees, or on your back with a pillow under your knees will often provide some relief. Keep in mind, being in any one position for a prolonged period of time, no matter how correct your posture, can still lead to stiffness. WALKING Walk with an upright posture. Your ears, shoulders, and hips should all line up. OFFICE WORK When working at a desk, create an environment that supports good, upright posture. Without extra support, muscles fatigue and lead to excessive strain on joints and other tissues. CHAIR:  A chair should be able to slide under your desk when your back  makes contact with the back of the chair. This allows you to work closely.  The chair's height should allow your eyes to be level with the upper part of your monitor and your hands to be slightly lower than your elbows.  Body position:  Your feet should make contact with the floor. If this is not possible, use a foot rest.  Keep your ears over your shoulders. This will reduce stress on your neck and low back.   This information is not intended to replace advice given to you by your health care provider. Make sure you discuss any questions you have with your health care provider.   Document Released: 03/22/2005 Document Revised: 04/12/2014 Document Reviewed: 07/04/2008 Elsevier Interactive Patient Education [employer]

## 2016-10-10 ENCOUNTER — Emergency Department (HOSPITAL_COMMUNITY): Payer: Self-pay

## 2016-10-10 ENCOUNTER — Encounter (HOSPITAL_COMMUNITY): Payer: Self-pay | Admitting: Emergency Medicine

## 2016-10-10 ENCOUNTER — Emergency Department (HOSPITAL_COMMUNITY)
Admission: EM | Admit: 2016-10-10 | Discharge: 2016-10-10 | Disposition: A | Discharge status: Home / Self Care | Payer: Self-pay | Attending: Emergency Medicine | Admitting: Emergency Medicine

## 2016-10-10 DIAGNOSIS — R0789 Other chest pain: Secondary | ICD-10-CM

## 2016-10-10 DIAGNOSIS — R1011 Right upper quadrant pain: Secondary | ICD-10-CM | POA: Insufficient documentation

## 2016-10-10 DIAGNOSIS — J45909 Unspecified asthma, uncomplicated: Secondary | ICD-10-CM | POA: Insufficient documentation

## 2016-10-10 DIAGNOSIS — R252 Cramp and spasm: Secondary | ICD-10-CM | POA: Insufficient documentation

## 2016-10-10 LAB — BASIC METABOLIC PANEL
ANION GAP: 7 (ref 5–15)
BUN: 8 mg/dL (ref 6–20)
CALCIUM: 9.6 mg/dL (ref 8.9–10.3)
CO2: 27 mmol/L (ref 22–32)
Chloride: 102 mmol/L (ref 101–111)
Creatinine, Ser: 1.3 mg/dL — ABNORMAL HIGH (ref 0.61–1.24)
GLUCOSE: 101 mg/dL — AB (ref 65–99)
Potassium: 3.7 mmol/L (ref 3.5–5.1)
Sodium: 136 mmol/L (ref 135–145)

## 2016-10-10 LAB — CBC
HCT: 47.3 % (ref 39.0–52.0)
HEMOGLOBIN: 16.7 g/dL (ref 13.0–17.0)
MCH: 31.3 pg (ref 26.0–34.0)
MCHC: 35.3 g/dL (ref 30.0–36.0)
MCV: 88.6 fL (ref 78.0–100.0)
PLATELETS: 187 10*3/uL (ref 150–400)
RBC: 5.34 MIL/uL (ref 4.22–5.81)
RDW: 12.8 % (ref 11.5–15.5)
WBC: 9.7 10*3/uL (ref 4.0–10.5)

## 2016-10-10 LAB — D-DIMER, QUANTITATIVE: D-Dimer, Quant: <0.27 ug/mL-FEU (ref 0.00–0.50)

## 2016-10-10 LAB — URINALYSIS, ROUTINE W REFLEX MICROSCOPIC
Bilirubin Urine: NEGATIVE
Glucose, UA: NEGATIVE mg/dL
Hgb urine dipstick: NEGATIVE
Ketones, ur: NEGATIVE mg/dL
Leukocytes, UA: NEGATIVE
Nitrite: NEGATIVE
Protein, ur: NEGATIVE mg/dL
Specific Gravity, Urine: 1.018 (ref 1.005–1.030)
pH: 6 (ref 5.0–8.0)

## 2016-10-10 LAB — HEPATIC FUNCTION PANEL
ALBUMIN: 4.6 g/dL (ref 3.5–5.0)
ALT: 34 U/L (ref 17–63)
AST: 24 U/L (ref 15–41)
Alkaline Phosphatase: 47 U/L (ref 38–126)
BILIRUBIN DIRECT: 0.1 mg/dL (ref 0.1–0.5)
BILIRUBIN INDIRECT: 1.3 mg/dL — AB (ref 0.3–0.9)
BILIRUBIN TOTAL: 1.4 mg/dL — AB (ref 0.3–1.2)
Total Protein: 7.3 g/dL (ref 6.5–8.1)

## 2016-10-10 LAB — I-STAT TROPONIN, ED: TROPONIN I, POC: 0 ng/mL (ref 0.00–0.08)

## 2016-10-10 MED ORDER — METHOCARBAMOL 500 MG PO TABS: 500.0000 mg | ORAL_TABLET | Freq: Two times a day (BID) | ORAL | 0 refills | Status: AC

## 2016-10-10 MED ORDER — ACETAMINOPHEN 500 MG PO TABS: 500.0000 mg | ORAL_TABLET | Freq: Four times a day (QID) | ORAL | 0 refills | Status: AC | PRN

## 2016-10-10 NOTE — ED Notes (Signed)
Pt continues to wait for ultrasound

## 2016-10-10 NOTE — ED Triage Notes (Signed)
Pt c/o right sided chest pain intermittent for about 2 weeks. Pt recently had a trip to [place] on the [place] th of June.

## 2016-10-10 NOTE — ED Notes (Signed)
Pt continues to deny any chest pain.  St's pain in right abd is better but feels tight.

## 2016-10-10 NOTE — ED Notes (Signed)
Pt returned from ultrasound

## 2016-10-10 NOTE — ED Provider Notes (Signed)
Sign out from Mathews RobinsonsJessica Mitchell, PA-C at shift change  Intermittent chest pain described as shock like sensation radiating to RUQ for 2 weeks. RUQ US pending. If negative, discharge home with Robaxin. No NSAIDS considering mild elevation in creatinine (1.30). Suspect musculoskeletal.  RUQ US normal. CBC unremarkable. BMP otherwise within normal limits with exception of creatinine 1.0. D-dimer <0.27. Troponin negative. CXR shows no active cardiopulmonary disease. UA negative. EKG shows NSR and no acute ST/T-wave changes; no previous EKG to compare. Patient to follow-up with PCP for further valuation and treatment and assessment of elevated creatinine. Strict return precautions discussed. We'll discharge home with Robaxin and Tylenol. Patient understands and agrees with plan. Patient vitals stable and discharged in satisfactory condition.     Emi HolesLaw, Adelle Zachar M, PA-C 10/11/16 Ivor Reining0003    Pricilla LovelessGoldston, Scott, MD 10/14/16 [phone]

## 2016-10-10 NOTE — Discharge Instructions (Signed)
Medications: Robaxin  Treatment: Take Robaxin twice daily as needed for muscle pain and spasms. Do not drive or operate machinery when taking this medication. Take Tylenol every 6 hours as needed for ear pain. Use an ice pack to the area 3-4 times daily alternating 20 minutes on, 20 minutes off.  Follow-up: Please follow-up and establish care with a primary care provider for further evaluation and treatment, as well as follow-up of your mildly elevated creatinine and mild decrease in kidney function. Please return to the emergency department if you develop any new or worsening symptoms.

## 2016-10-10 NOTE — ED Provider Notes (Signed)
[facility]-EMERGENCY DEPT Provider Note   CSN: [id number] Arrival date & time: 10/10/16  1529     History   Chief Complaint Chief Complaint  Patient presents with  . Chest Pain    HPI Charles Camacho is a 31 y.o. male with no significant past medical history presenting with 2 weeks of intermittent sharp stabbing right-sided nonexertional, nonradiating chest pain with no associated symptoms and right upper quadrant cramping. He expresses concern that it may be due to drinking. Denies fever, chills, dysuria, hematuria, no nausea/vomiting, diarrhea. Patient denies history of DVT/PE, recent surgery, cough, hemoptysis, shortness of breath, malignancy, unilateral leg swelling or calf pain but does report recent travel to [place] on 26 hour flight without moving around the cabin. He denies any pain at this time.  HPI  Past Medical History:  Diagnosis Date  . Asthma    In the past    There are no active problems to display for this patient.   History reviewed. No pertinent surgical history.     Home Medications    Prior to Admission medications   Medication Sig Start Date End Date Taking? Authorizing Provider  Multiple Vitamin (MULTIVITAMIN WITH MINERALS) TABS tablet Take 1 tablet by mouth daily.   Yes [provider]    Family History No family history on file.  Social History Social History  Substance Use Topics  . Smoking status: Never Smoker  . Smokeless tobacco: Never Used  . Alcohol use Yes     Comment: special occasions     Allergies   Shrimp [shellfish allergy]   Review of Systems Review of Systems  Constitutional: Negative for chills and fever.  HENT: Negative for congestion, ear pain and sore throat.   Eyes: Negative for pain and visual disturbance.  Respiratory: Negative for cough, choking, chest tightness, shortness of breath, wheezing and stridor.   Cardiovascular: Negative for chest pain, palpitations and leg swelling.  Gastrointestinal:  Positive for abdominal pain. Negative for abdominal distention, blood in stool, diarrhea, nausea and vomiting.       Right upper quadrant  Genitourinary: Negative for difficulty urinating, dysuria, frequency and hematuria.  Musculoskeletal: Negative for arthralgias, back pain, neck pain and neck stiffness.  Skin: Negative for color change, pallor and rash.  Neurological: Negative for dizziness, seizures, syncope and light-headedness.     Physical Exam Updated Vital Signs BP 131/86   Pulse 71   Temp 98.2 F (36.8 C) (Oral)   Resp 20   Ht 6\' 2"  (1.88 m)   Wt 97.5 kg (215 lb)   SpO2 98%   BMI 27.60 kg/m   Physical Exam  Constitutional: He appears well-developed and well-nourished. No distress.  HENT:  Head: Normocephalic and atraumatic.  Eyes: Conjunctivae and EOM are normal.  Neck: Neck supple.  Cardiovascular: Normal rate, regular rhythm, normal heart sounds and intact distal pulses.   No murmur heard. Pulmonary/Chest: Effort normal and breath sounds normal. No respiratory distress. He has no wheezes. He has no rales. He exhibits no tenderness.  Abdominal: Soft. There is no tenderness.  Musculoskeletal: He exhibits no edema.  Neurological: He is alert.  Skin: Skin is warm and dry. He is not diaphoretic.  Psychiatric: He has a normal mood and affect.  Nursing note and vitals reviewed.    ED Treatments / Results  Labs (all labs ordered are listed, but only abnormal results are displayed) Labs Reviewed  BASIC METABOLIC PANEL - Abnormal; Notable for the following:       Result  Value   Glucose, Bld 101 (*)    Creatinine, Ser 1.30 (*)    All other components within normal limits  HEPATIC FUNCTION PANEL - Abnormal; Notable for the following:    Total Bilirubin 1.4 (*)    Indirect Bilirubin 1.3 (*)    All other components within normal limits  CBC  D-DIMER, QUANTITATIVE (NOT AT [facility])  I-STAT TROPOININ, ED    EKG  EKG Interpretation  Date/Time:  Sunday October 10 2016  15:30:30 EDT Ventricular Rate:  86 PR Interval:  136 QRS Duration: 78 QT Interval:  350 QTC Calculation: 418 R Axis:   89 Text Interpretation:  Normal sinus rhythm no acute ST/T changes No old tracing to compare Confirmed by Pricilla LovelessGoldston, Scott [phone]([id number]) on 10/10/2016 4:52:17 PM       Radiology Dg Chest 2 View  Result Date: 10/10/2016 CLINICAL DATA:  Chest pain EXAM: CHEST  2 VIEW COMPARISON:  May 30, 2014 FINDINGS: There is no edema or consolidation. The heart size and pulmonary vascularity are normal. No adenopathy. No evident bone lesions. IMPRESSION: No edema or consolidation. Electronically Signed   By: Bretta BangWilliam  Woodruff III M.D.   On: 10/10/2016 16:23    Procedures Procedures (including critical care time)  Medications Ordered in ED Medications - No data to display   Initial Impression / Assessment and Plan / ED Course  I have reviewed the triage vital signs and the nursing notes.  Pertinent labs & imaging results that were available during my care of the patient were reviewed by me and considered in my medical decision making (see chart for details).     Patient presenting with shock light and terms 10 sharp right-sided chest pain over the last 2 weeks. A single risk factor for DVT/PE was a 26 hour flight recently. D-dimer was negative. Low suspicion for PE in this patient.  Labs are unremarkable, negative chest x-ray, negative EKG. Heart score: 0 Low suspicion for ACS in this patient.  I suspect this is musculoskeletal but patient reported some pain in the right upper quadrant under his rib. Ordered right upper quadrant ultrasound.  Transferred patient care at end of shift [facility], PA-C pending right upper quadrant ultrasound. Anticipate discharge home with symptomatic relief and close follow-up with PCP.  Final Clinical Impressions(s) / ED Diagnoses   Final diagnoses:  RUQ cramping    New Prescriptions New Prescriptions   No medications on file       Gregary CromerMitchell, [place] B, PA-C 10/10/16 2030    Georgiana ShoreMitchell, Senora Lacson B, PA-C 10/10/16 2031    Pricilla LovelessGoldston, Scott, MD 10/10/16 2312

## 2018-02-01 ENCOUNTER — Emergency Department (HOSPITAL_COMMUNITY): Payer: Self-pay

## 2018-02-01 ENCOUNTER — Encounter (HOSPITAL_COMMUNITY): Payer: Self-pay | Admitting: Emergency Medicine

## 2018-02-01 ENCOUNTER — Emergency Department (HOSPITAL_COMMUNITY)
Admission: EM | Admit: 2018-02-01 | Discharge: 2018-02-01 | Disposition: A | Discharge status: Home / Self Care | Payer: Self-pay | Attending: Emergency Medicine | Admitting: Emergency Medicine

## 2018-02-01 ENCOUNTER — Other Ambulatory Visit: Payer: Self-pay

## 2018-02-01 DIAGNOSIS — J4530 Mild persistent asthma, uncomplicated: Secondary | ICD-10-CM | POA: Insufficient documentation

## 2018-02-01 MED ORDER — PREDNISONE 20 MG PO TABS
40.0000 mg | ORAL_TABLET | Freq: Once | ORAL | Status: AC
  Administered 2018-02-01: 40 mg via ORAL
  Filled 2018-02-01: qty 2

## 2018-02-01 MED ORDER — AEROCHAMBER PLUS FLO-VU MEDIUM MISC
1.0000 | Freq: Once | Status: AC
  Administered 2018-02-01: 1
  Filled 2018-02-01: qty 1

## 2018-02-01 MED ORDER — PREDNISONE 10 MG PO TABS: 20.0000 mg | ORAL_TABLET | Freq: Every day | ORAL | 0 refills | Status: AC

## 2018-02-01 MED ORDER — ALBUTEROL SULFATE HFA 108 (90 BASE) MCG/ACT IN AERS
2.0000 | INHALATION_SPRAY | RESPIRATORY_TRACT | Status: DC | PRN
  Administered 2018-02-01: 2 via RESPIRATORY_TRACT
  Filled 2018-02-01: qty 6.7

## 2018-02-01 NOTE — ED Notes (Signed)
Patient transported to X-ray 

## 2018-02-01 NOTE — Discharge Instructions (Addendum)
Inhaler 2 puffs every 4 hours Complete prednisone as prescribed Have your blood pressure rechecked by your primary care doctor as it is somewhat elevated here today Return for recheck if you have leg swelling, fever, worsening shortness of breath or productive cough

## 2018-02-01 NOTE — ED Provider Notes (Signed)
[facility] EMERGENCY DEPARTMENT Provider Note   CSN: [id number] Arrival date & time: 02/01/18  1538     History   Chief Complaint Chief Complaint  Patient presents with  . Cough    HPI Charles Camacho is a 31 y.o. male.  HPI 32 year old male presents today complaining of 3 weeks of cough.  He states symptoms began with some voice loss and URI symptoms.  He has continued to cough but has not been productive.  He has not been running a fever.  He feels like he gets short of breath after coughing spells but is not short of breath otherwise.  He did recently fly from [place] but denies any leg swelling, history of DVT, or PE.  He is otherwise healthy.  He denies smoking or vaping.  He has a history of asthma as a child but states he has not had significant problems with it recently.  He did not get a flu shot this year. Past Medical History:  Diagnosis Date  . Asthma    In the past    There are no active problems to display for this patient.   History reviewed. No pertinent surgical history.      Home Medications    Prior to Admission medications   Medication Sig Start Date End Date Taking? Authorizing Provider  acetaminophen (TYLENOL) 500 MG tablet Take 1 tablet (500 mg total) by mouth every 6 (six) hours as needed. 10/10/16   Law, Waylan Boga, PA-C  methocarbamol (ROBAXIN) 500 MG tablet Take 1 tablet (500 mg total) by mouth 2 (two) times daily. 10/10/16   Emi Holes, PA-C  Multiple Vitamin (MULTIVITAMIN WITH MINERALS) TABS tablet Take 1 tablet by mouth daily.    [provider]    Family History No family history on file.  Social History Social History   Tobacco Use  . Smoking status: Never Smoker  . Smokeless tobacco: Never Used  Substance Use Topics  . Alcohol use: Yes    Comment: special occasions  . Drug use: No     Allergies   Shrimp [shellfish allergy]   Review of Systems Review of Systems  All other systems  reviewed and are negative.    Physical Exam Updated Vital Signs BP (!) 132/105   Pulse 98   Temp 98.7 F (37.1 C) (Oral)   Resp 18   Ht 1.854 m (6\' 1" )   Wt 92.1 kg   SpO2 99%   BMI 26.78 kg/m   Physical Exam  Constitutional: He is oriented to person, place, and time. He appears well-developed and well-nourished.  HENT:  Head: Normocephalic and atraumatic.  Right Ear: External ear normal.  Left Ear: External ear normal.  Mouth/Throat: Oropharynx is clear and moist.  Eyes: Pupils are equal, round, and reactive to light. EOM are normal.  Neck: Normal range of motion.  Cardiovascular: Normal rate and regular rhythm.  Pulmonary/Chest: Effort normal and breath sounds normal.  Few scattered rhonchi with coarse breath sounds  Abdominal: Soft. Bowel sounds are normal.  Musculoskeletal: Normal range of motion. He exhibits no edema.  Neurological: He is oriented to person, place, and time.  Skin: Skin is warm and dry. Capillary refill takes less than 2 seconds.  Psychiatric: He has a normal mood and affect.  Nursing note and vitals reviewed.    ED Treatments / Results  Labs (all labs ordered are listed, but only abnormal results are displayed) Labs Reviewed - No data to display  EKG EKG Interpretation  Date/Time:  Wednesday February 01 2018 16:20:24 EDT Ventricular Rate:  89 PR Interval:  136 QRS Duration: 80 QT Interval:  348 QTC Calculation: 423 R Axis:   94 Text Interpretation:  Normal sinus rhythm Rightward axis t wave abnormality more pronounced than last tracing of 10/10/16 Confirmed by Margarita Grizzle [phone]) on 02/01/2018 5:05:23 PM   Radiology Dg Chest 2 View  Result Date: 02/01/2018 CLINICAL DATA:  Cough and shortness of breath for the past 3 weeks. EXAM: CHEST - 2 VIEW COMPARISON:  10/10/2016. FINDINGS: Normal sized heart. Clear lungs. Minimal peribronchial thickening. Unremarkable bones. IMPRESSION: Minimal bronchitic changes. Electronically Signed   By:  Beckie Salts M.D.   On: 02/01/2018 17:17    Procedures Procedures (including critical care time)  Medications Ordered in ED Medications  albuterol (PROVENTIL HFA;VENTOLIN HFA) 108 (90 Base) MCG/ACT inhaler 2 puff (has no administration in time range)  AEROCHAMBER PLUS FLO-VU MEDIUM MISC 1 each (has no administration in time range)  predniSONE (DELTASONE) tablet 40 mg (has no administration in time range)     Initial Impression / Assessment and Plan / ED Course  I have reviewed the triage vital signs and the nursing notes.  Pertinent labs & imaging results that were available during my care of the patient were reviewed by me and considered in my medical decision making (see chart for details).   32 year old male presents today complaining of cough that began with URI symptoms several weeks ago.  He has a history of asthma and although is not overtly wheezing does have a few scattered rhonchi.  He has recently flown but is PERC negative and have low suspicion for PE.  And symptomatic treatment with albuterol inhaler and prednisone.  We have discussed return precautions and need for close follow-up and patient voices understanding. 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

## 2018-02-01 NOTE — ED Triage Notes (Signed)
Pt reports that he has had a cough x 3 weeks since his travel to [place]. Reports that he feels like its hard to catch his breath when he's talking.

## 2018-02-01 NOTE — ED Notes (Signed)
Returned from xray

## 2018-07-06 IMAGING — US US ABDOMEN LIMITED
1 series · 14 of 25 positions shown · non-contrast
Comparison: CT abdomen and pelvis May 30, 2014

CLINICAL DATA: Right upper quadrant pain

EXAM:
ULTRASOUND ABDOMEN LIMITED RIGHT UPPER QUADRANT

[Series 1: us abdomen limited · 0.20mm/px · 14 of 35 slices shown]
[im 1/35]
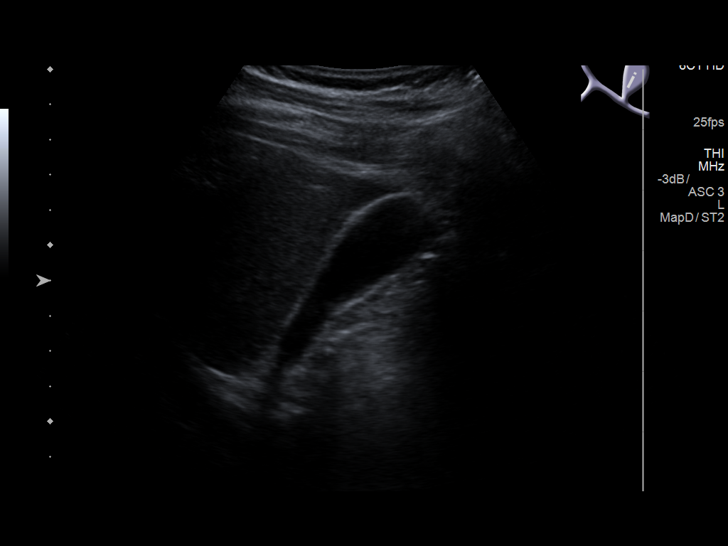
[im 3/35]
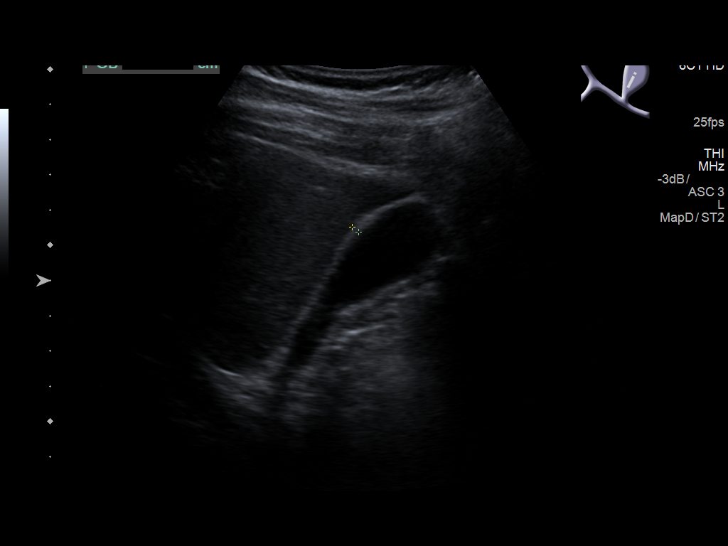
[im 6/35]
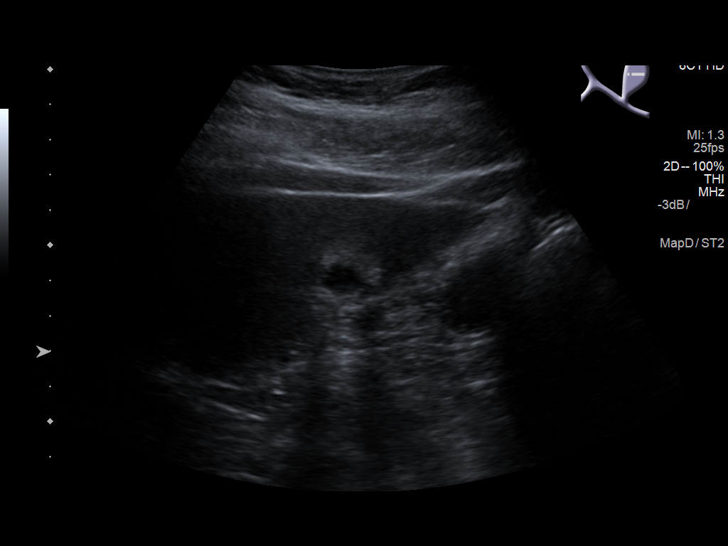
[im 9/35]
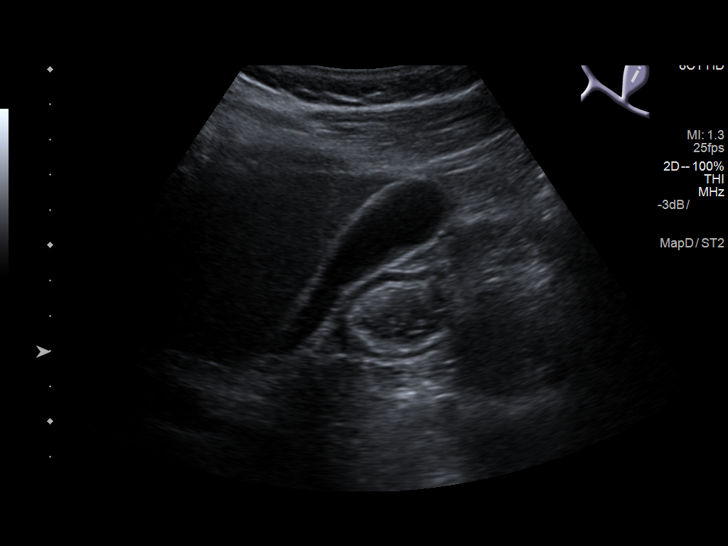
[im 12/35]
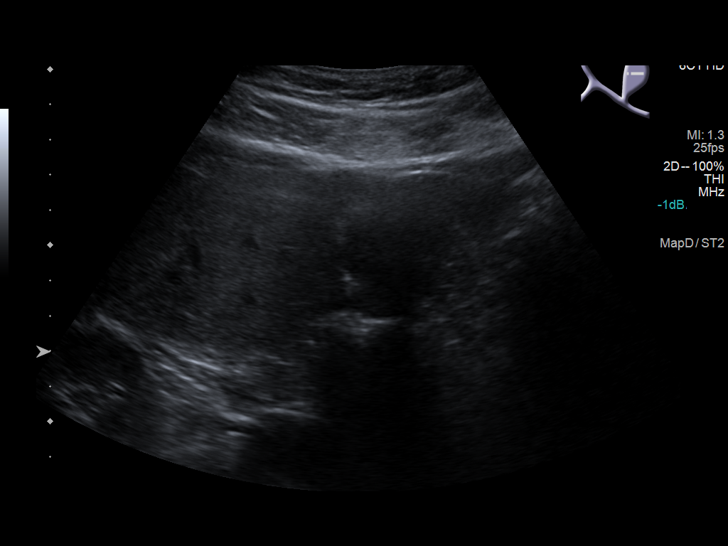
[im 13/35]
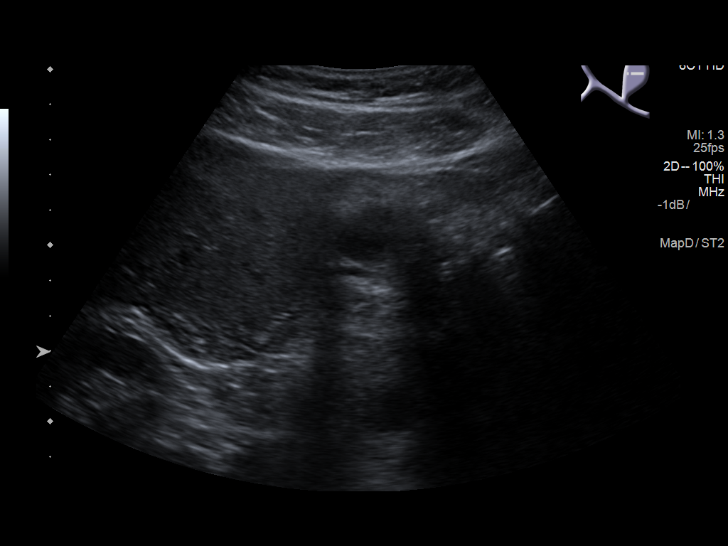
[im 16/35]
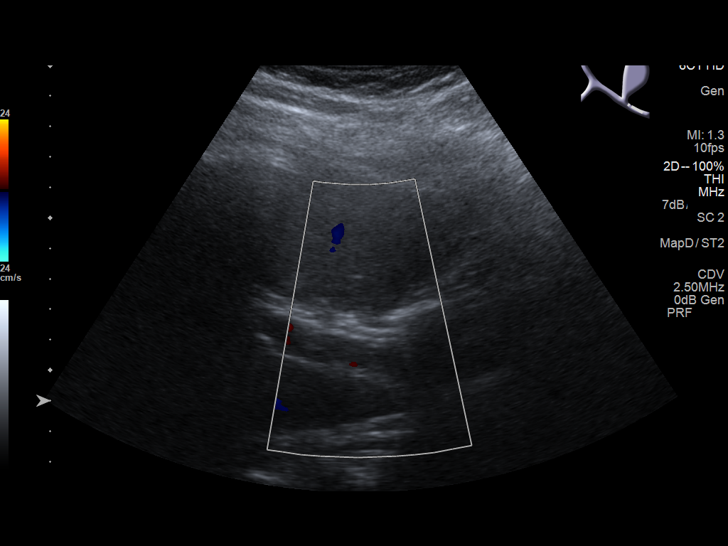
[im 19/35]
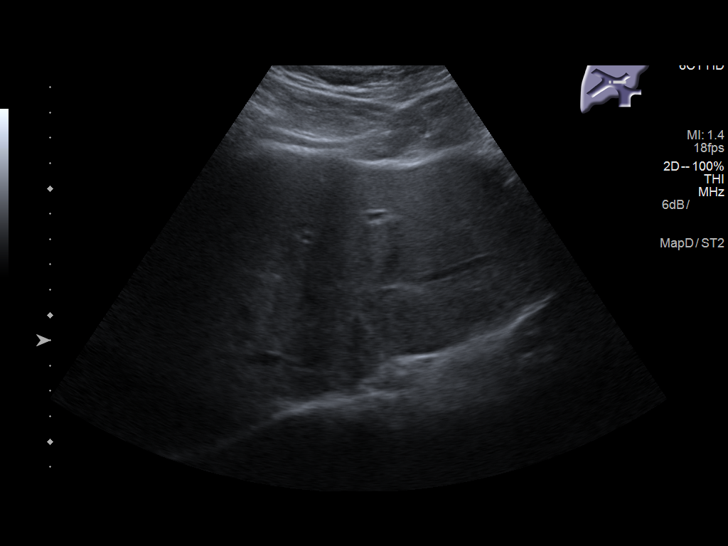
[im 22/35]
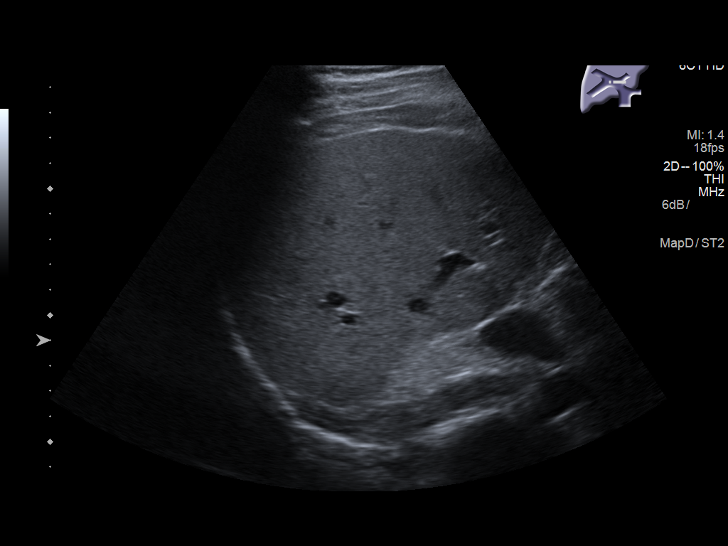
[im 23/35]
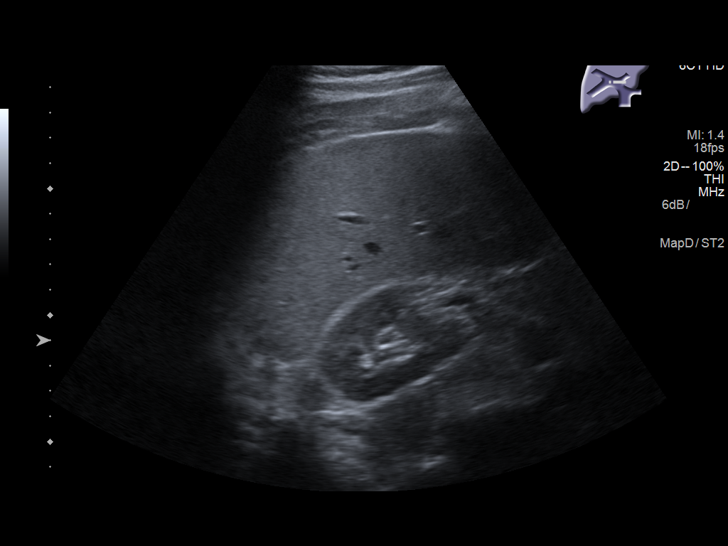
[im 26/35]
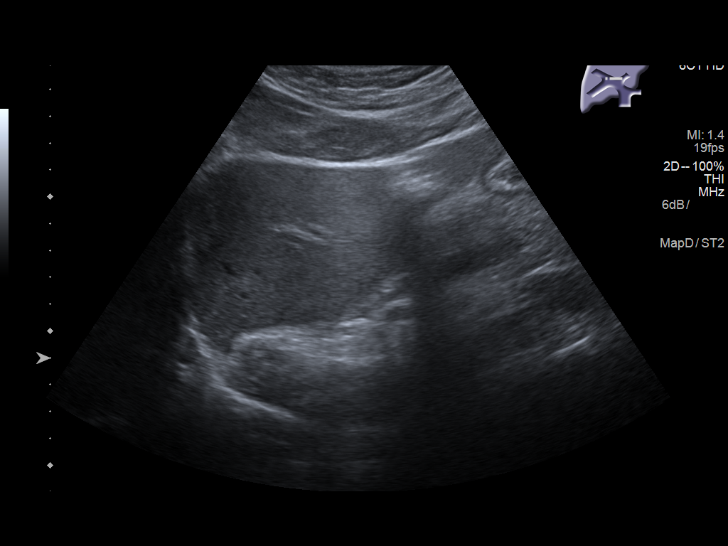
[im 29/35]
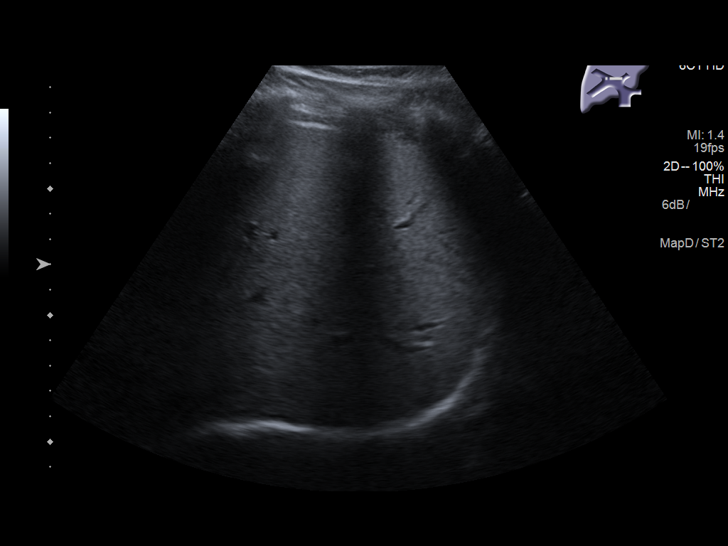
[im 32/35]
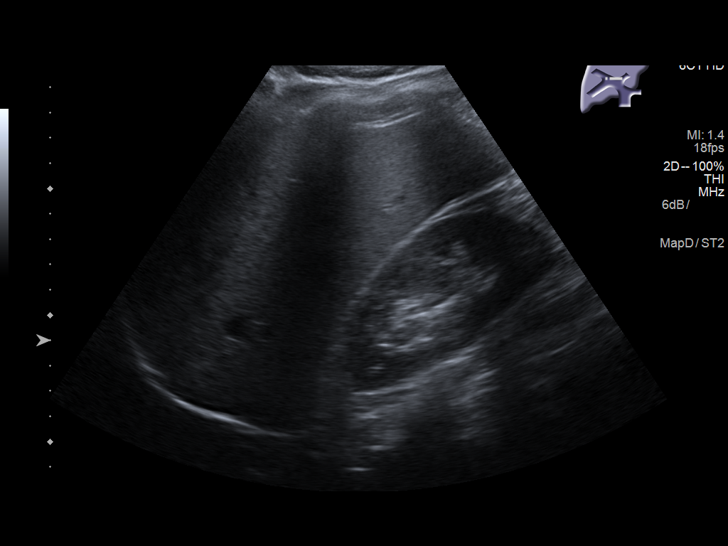
[im 35/35]
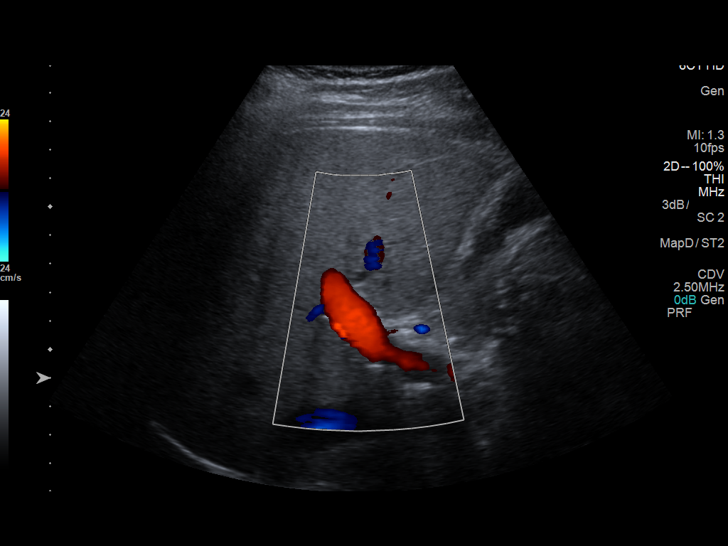

[14 of 25 positions shown; findings below may reference images not displayed]

FINDINGS: Gallbladder:

No gallstones or wall thickening visualized. There is no
pericholecystic fluid. No sonographic Murphy sign noted by
sonographer.

Common bile duct:

Diameter: 3 mm. No intrahepatic or extrahepatic biliary duct
dilatation.

Liver:

No focal lesion identified. Within normal limits in parenchymal
echogenicity.
IMPRESSION: Study within normal limits.

## 2020-07-31 IMAGING — DX DG CHEST 2V
2 series · 2 of 2 positions shown · non-contrast
Comparison: 10/10/2016.

CLINICAL DATA: Cough and shortness of breath for the past 3 weeks.

EXAM:
CHEST - 2 VIEW

[w chest pa]
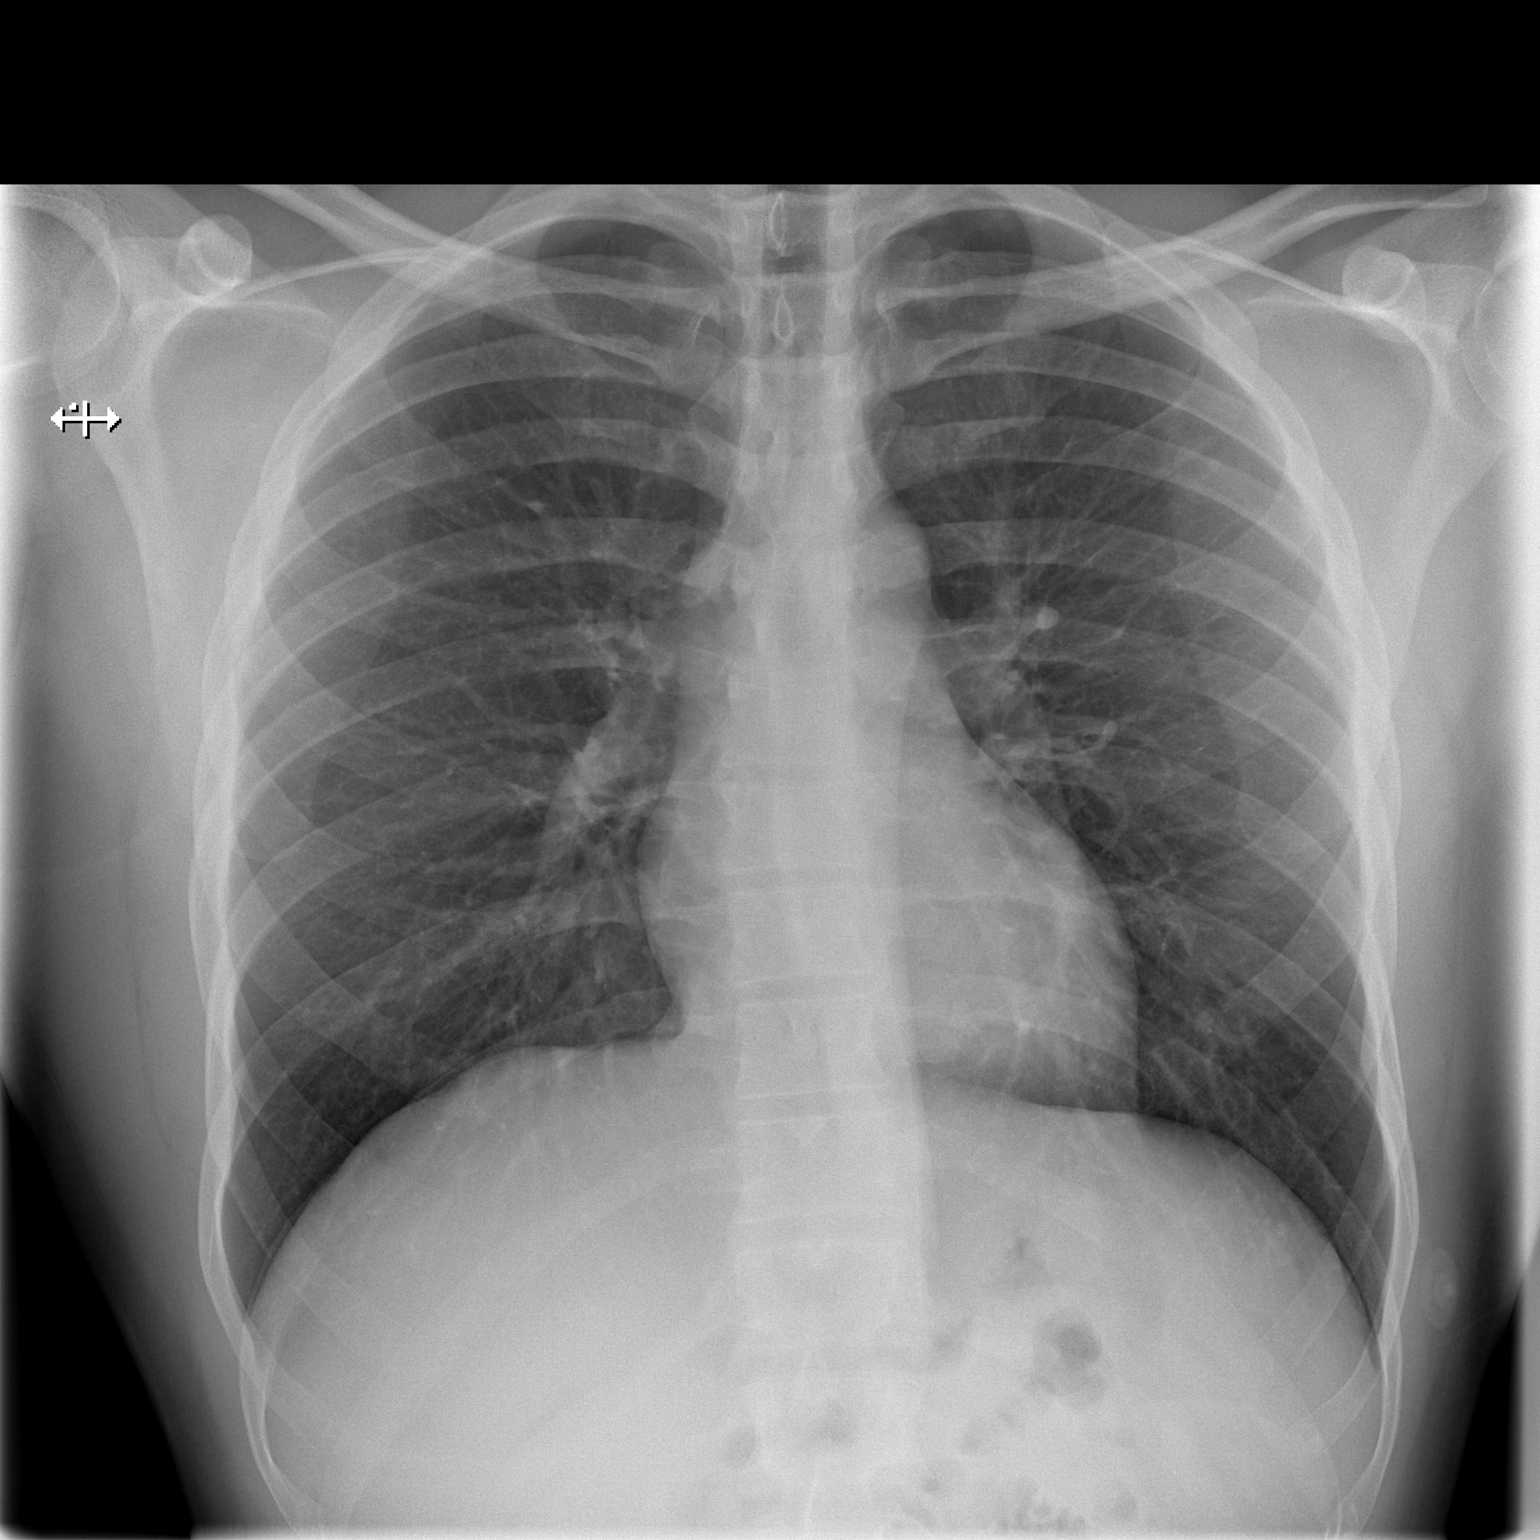

[w chest lat]
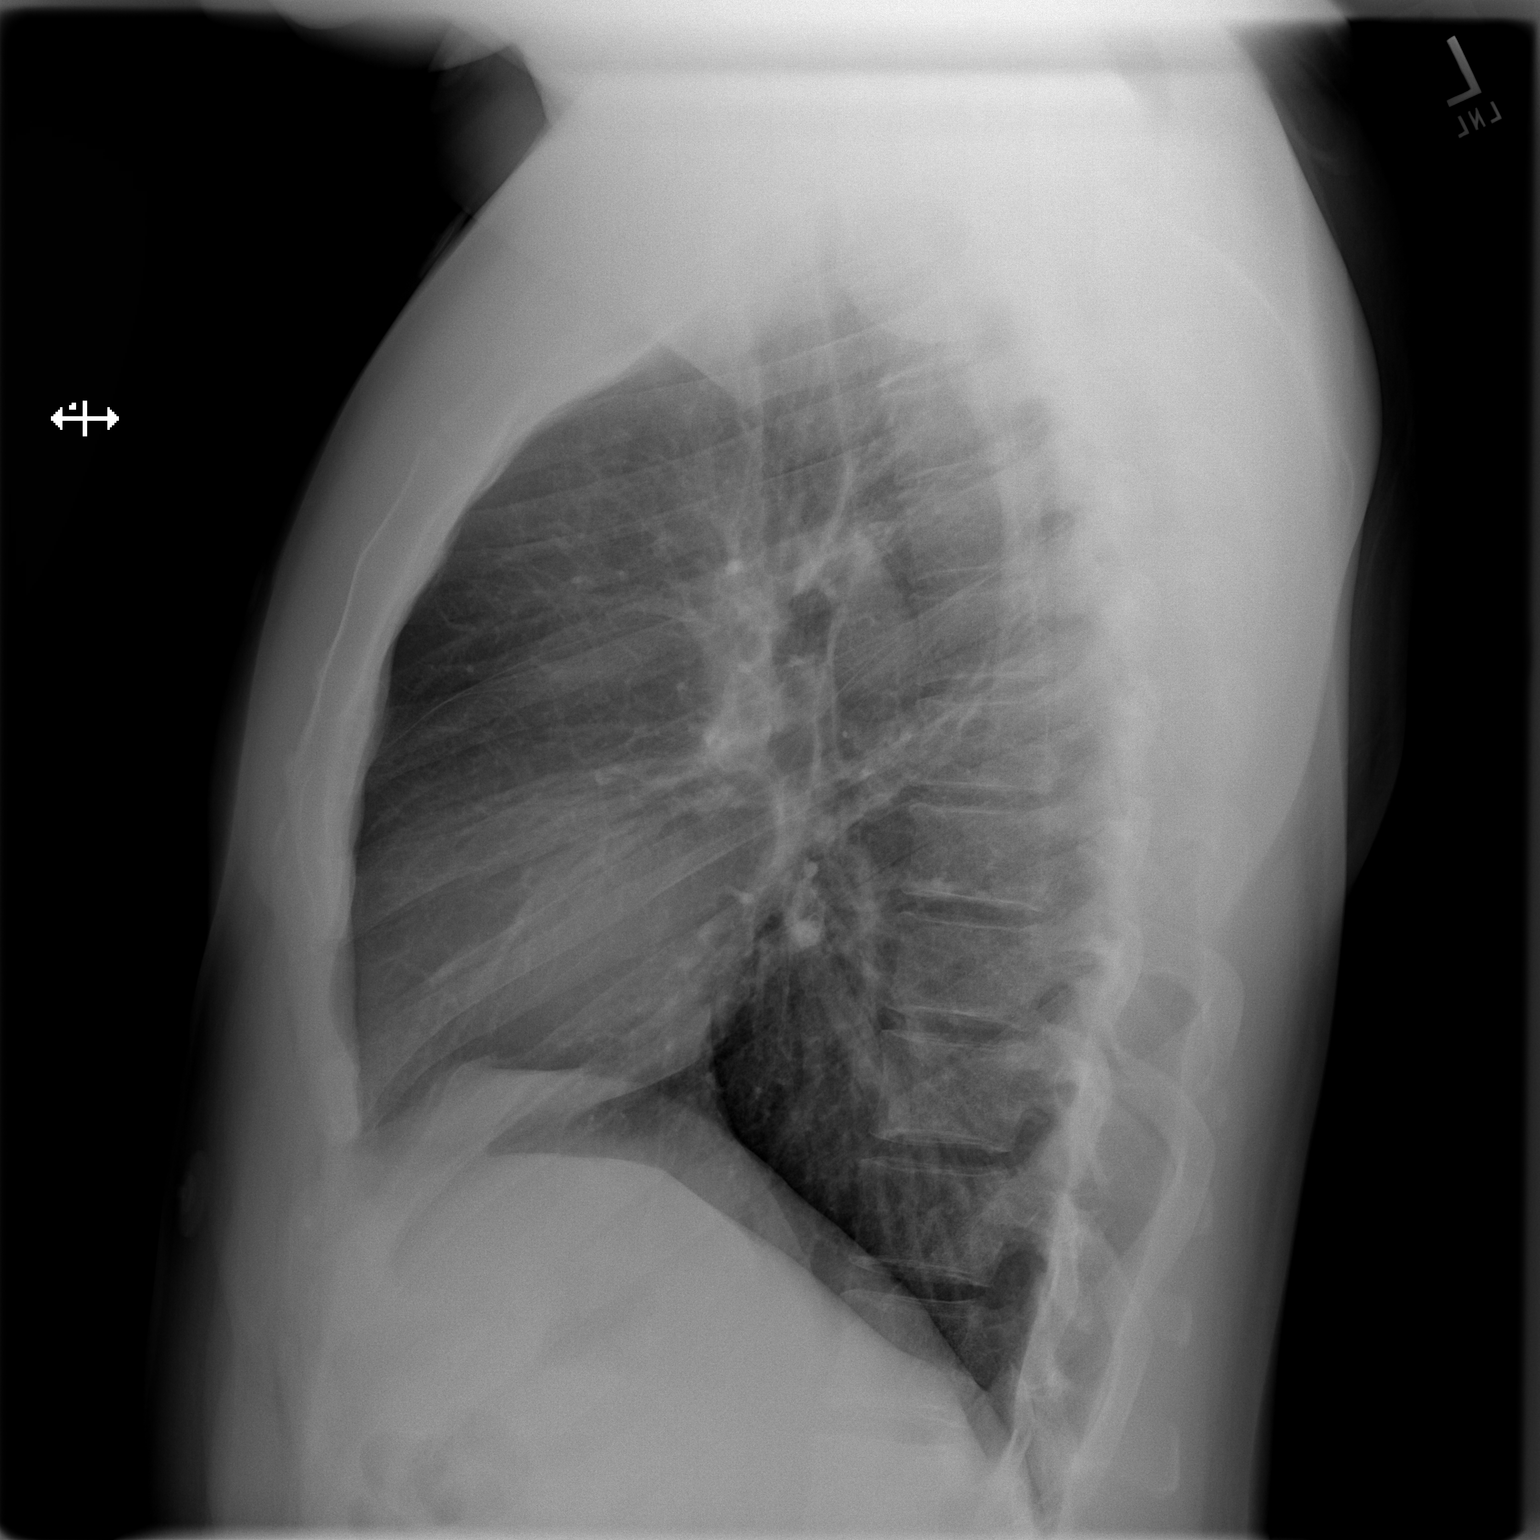

[2 of 2 positions shown; findings below may reference images not displayed]

FINDINGS: Normal sized heart. Clear lungs. Minimal peribronchial thickening.
Unremarkable bones.
IMPRESSION: Minimal bronchitic changes.
